# Patient Record
Sex: Male | Born: 1960 | Race: White | Hispanic: No | Marital: Single | Smoking: Current every day smoker
Health system: Southern US, Community
[De-identification: ages and names within clinical notes are randomized; demographics above are authoritative.]

## PROBLEM LIST (undated history)

## (undated) DIAGNOSIS — K259 Gastric ulcer, unspecified as acute or chronic, without hemorrhage or perforation: Secondary | ICD-10-CM

## (undated) DIAGNOSIS — E7800 Pure hypercholesterolemia, unspecified: Secondary | ICD-10-CM

## (undated) HISTORY — PX: SMALL INTESTINE SURGERY: SHX150

## (undated) NOTE — *Deleted (*Deleted)
Come POV, states he was walking quite a bit 3 days ago and now had severe left ankle pain at achilles tendon. No swelling.

---

## 2005-09-28 ENCOUNTER — Inpatient Hospital Stay (HOSPITAL_COMMUNITY): Admission: EM | Admit: 2005-09-28 | Discharge: 2005-10-05 | Payer: Self-pay | Admitting: Emergency Medicine

## 2016-08-08 NOTE — Congregational Nurse Program (Signed)
Congregational Nurse Program Note  Date of Encounter: 08/08/2016  Past Medical History: No past medical history on file.  Encounter Details:     CNP Questionnaire - 08/08/16 2209      Patient Demographics   Is this a new or existing patient? New   Patient is considered a/an Not Applicable   Race Caucasian/White     Patient Assistance   Location of Patient [place]   Patient's financial/insurance status Self-Pay (Uninsured)   Uninsured Patient ([place]) No   Patient referred to apply for the following financial assistance Not Applicable   Food insecurities addressed Provided food supplies   Transportation assistance No   Assistance securing medications No   Educational Programmer, systems the healthcare system     Encounter Details   Primary purpose of visit Navigating the Healthcare System;Chronic Illness/Condition Visit   Was an Emergency Department visit averted? No   Does patient have a medical provider? No   Patient referred to Clinic   Was a mental health screening completed? (GAINS tool) No   Does patient have dental issues? No   Does patient have vision issues? No   Does your patient have an abnormal blood pressure today? No   Since previous encounter, have you referred patient for abnormal blood pressure that resulted in a new diagnosis or medication change? No   Does your patient have an abnormal blood glucose today? No   Since previous encounter, have you referred patient for abnormal blood glucose that resulted in a new diagnosis or medication change? No   Was there a life-saving intervention made? No     Client was seen at the  [facility] to assist with establishing a primary care provider.  Appointment was made with the [facility] to apply for MAP with the [facility]. BP today is 94/74  P-86.  Pearletha Alfred [phone].

## 2017-06-20 ENCOUNTER — Other Ambulatory Visit (HOSPITAL_COMMUNITY): Payer: Self-pay | Admitting: Family Medicine

## 2017-06-20 ENCOUNTER — Ambulatory Visit (HOSPITAL_COMMUNITY)
Admission: RE | Admit: 2017-06-20 | Discharge: 2017-06-20 | Disposition: A | Discharge status: Home / Self Care | Payer: Medicaid Other | Source: Ambulatory Visit | Attending: Family Medicine | Admitting: Family Medicine

## 2017-06-20 ENCOUNTER — Other Ambulatory Visit (HOSPITAL_COMMUNITY)
Admission: RE | Admit: 2017-06-20 | Discharge: 2017-06-20 | Disposition: A | Discharge status: Home / Self Care | Payer: Self-pay | Source: Ambulatory Visit | Attending: Internal Medicine | Admitting: Internal Medicine

## 2017-06-20 DIAGNOSIS — M4322 Fusion of spine, cervical region: Secondary | ICD-10-CM | POA: Insufficient documentation

## 2017-06-20 DIAGNOSIS — Z981 Arthrodesis status: Secondary | ICD-10-CM | POA: Diagnosis not present

## 2017-06-20 DIAGNOSIS — M4326 Fusion of spine, lumbar region: Secondary | ICD-10-CM

## 2017-06-20 DIAGNOSIS — E786 Lipoprotein deficiency: Secondary | ICD-10-CM | POA: Insufficient documentation

## 2019-05-23 ENCOUNTER — Ambulatory Visit: Payer: Medicaid Other | Attending: Internal Medicine

## 2019-05-23 ENCOUNTER — Other Ambulatory Visit: Payer: Self-pay

## 2019-05-23 DIAGNOSIS — Z20822 Contact with and (suspected) exposure to covid-19: Secondary | ICD-10-CM

## 2019-05-24 LAB — NOVEL CORONAVIRUS, NAA: SARS-CoV-2, NAA: NOT DETECTED

## 2020-03-17 ENCOUNTER — Other Ambulatory Visit: Payer: Self-pay

## 2020-03-17 ENCOUNTER — Emergency Department (HOSPITAL_COMMUNITY): Payer: Medicaid Other

## 2020-03-17 ENCOUNTER — Encounter (HOSPITAL_COMMUNITY): Payer: Self-pay

## 2020-03-17 ENCOUNTER — Emergency Department (HOSPITAL_COMMUNITY)
Admission: EM | Admit: 2020-03-17 | Discharge: 2020-03-17 | Disposition: A | Discharge status: Home / Self Care | Payer: Medicaid Other | Attending: Emergency Medicine | Admitting: Emergency Medicine

## 2020-03-17 DIAGNOSIS — M25572 Pain in left ankle and joints of left foot: Secondary | ICD-10-CM | POA: Diagnosis present

## 2020-03-17 DIAGNOSIS — F17210 Nicotine dependence, cigarettes, uncomplicated: Secondary | ICD-10-CM | POA: Insufficient documentation

## 2020-03-17 DIAGNOSIS — M7662 Achilles tendinitis, left leg: Secondary | ICD-10-CM | POA: Insufficient documentation

## 2020-03-17 HISTORY — DX: Gastric ulcer, unspecified as acute or chronic, without hemorrhage or perforation: K25.9

## 2020-03-17 HISTORY — DX: Pure hypercholesterolemia, unspecified: E78.00

## 2020-03-17 MED ORDER — DICLOFENAC SODIUM 1 % EX GEL: 2.0000 g | Freq: Four times a day (QID) | CUTANEOUS | 1 refills | Status: AC

## 2020-03-17 NOTE — Discharge Instructions (Addendum)
Return if any problems.

## 2020-03-17 NOTE — ED Provider Notes (Signed)
[facility] EMERGENCY DEPARTMENT Provider Note   CSN: [id number] Arrival date & time: 03/17/20  3557     History Chief Complaint  Patient presents with  . Ankle Pain    Earl Nelson is a 59 y.o. male.  The history is provided by the patient. No language interpreter was used.  Ankle Pain Location:  Ankle Time since incident:  3 days Injury: no   Ankle location:  L ankle Pain details:    Quality:  Aching   Severity:  Moderate   Onset quality:  Gradual   Timing:  Constant   Progression:  Worsening Foreign body present:  No foreign bodies Prior injury to area:  No Relieved by:  Nothing Worsened by:  Nothing Ineffective treatments:  None tried Pt complains of pain to the back of his ankle and heel.  Pt complains of pain with walking     Past Medical History:  Diagnosis Date  . Gastric ulcer   . Hypercholesteremia     There are no problems to display for this patient.   Past Surgical History:  Procedure Laterality Date  . SMALL INTESTINE SURGERY         No family history on file.  Social History   Tobacco Use  . Smoking status: Current Every Day Smoker    Packs/day: 0.50    Types: Cigarettes  Substance Use Topics  . Alcohol use: Not Currently  . Drug use: Not on file    Home Medications Prior to Admission medications   Medication Sig Start Date End Date Taking? Authorizing Provider  diclofenac Sodium (VOLTAREN) 1 % GEL Apply 2 g topically 4 (four) times daily. 03/17/20   Elson Areas, PA-C    Allergies    Patient has no known allergies.  Review of Systems   Review of Systems  All other systems reviewed and are negative.   Physical Exam Updated Vital Signs BP (!) 112/56   Pulse 76   Temp 98.3 F (36.8 C) (Oral)   Resp 18   Ht 5\' 9"  (1.753 m)   Wt 60.8 kg   SpO2 98%   BMI 19.79 kg/m   Physical Exam Vitals and nursing note reviewed.  Constitutional:      Appearance: He is well-developed.  HENT:     Head: Normocephalic and  atraumatic.  Cardiovascular:     Rate and Rhythm: Normal rate.  Pulmonary:     Effort: Pulmonary effort is normal. No respiratory distress.  Musculoskeletal:        General: Tenderness present. No swelling.     Comments: Tender left posterior heel and achilles,  Pain with plantar and dorsi flexion,  Tender achilles   Skin:    General: Skin is warm and dry.  Neurological:     General: No focal deficit present.     Mental Status: He is alert.  Psychiatric:        Mood and Affect: Mood normal.     ED Results / Procedures / Treatments   Labs (all labs ordered are listed, but only abnormal results are displayed) Labs Reviewed - No data to display  EKG None  Radiology DG Ankle Complete Left  Result Date: 03/17/2020 CLINICAL DATA:  Left ankle pain. EXAM: LEFT ANKLE COMPLETE - 3+ VIEW COMPARISON:  No prior. FINDINGS: Mild diffuse soft tissue swelling. Punctate corticated calcific density noted adjacent to the lateral malleolus, possibly tiny old fracture fragment. No acute fracture noted. No evidence of dislocation. Calcaneal spurring. Mild degenerative  change. Peripheral vascular calcification. IMPRESSION: 1. Mild diffuse soft tissue swelling. Punctate corticated calcific density noted adjacent to the lateral malleolus, possibly tiny old fracture fragment. No acute bony abnormality identified. 2. Peripheral vascular disease. Electronically Signed   By: Maisie Fus  Register   On: 03/17/2020 11:11    Procedures Procedures (including critical care time)  Medications Ordered in ED Medications - No data to display  ED Course  I have reviewed the triage vital signs and the nursing notes.  Pertinent labs & imaging results that were available during my care of the patient were reviewed by me and considered in my medical decision making (see chart for details).    MDM Rules/Calculators/A&P                          MDM:  Pt placed in an aso, given voltaren for area of pain.  Pt advised to  schedule to see Orthopaedist for evaluation Final Clinical Impression(s) / ED Diagnoses Final diagnoses:  Achilles tendinitis of left lower extremity    Rx / DC Orders ED Discharge Orders         Ordered    diclofenac Sodium (VOLTAREN) 1 % GEL  4 times daily        03/17/20 1138        An After Visit Summary was printed and given to the patient.    Elson Areas, [place] 03/17/20 1346    Eber Hong, MD 03/21/20 [phone]

## 2021-01-23 IMAGING — DX DG ANKLE COMPLETE 3+V*L*
3 series · 3 of 3 positions shown · non-contrast
Comparison: No prior.

CLINICAL DATA: Left ankle pain.

EXAM:
LEFT ANKLE COMPLETE - 3+ VIEW

[ankle ap]
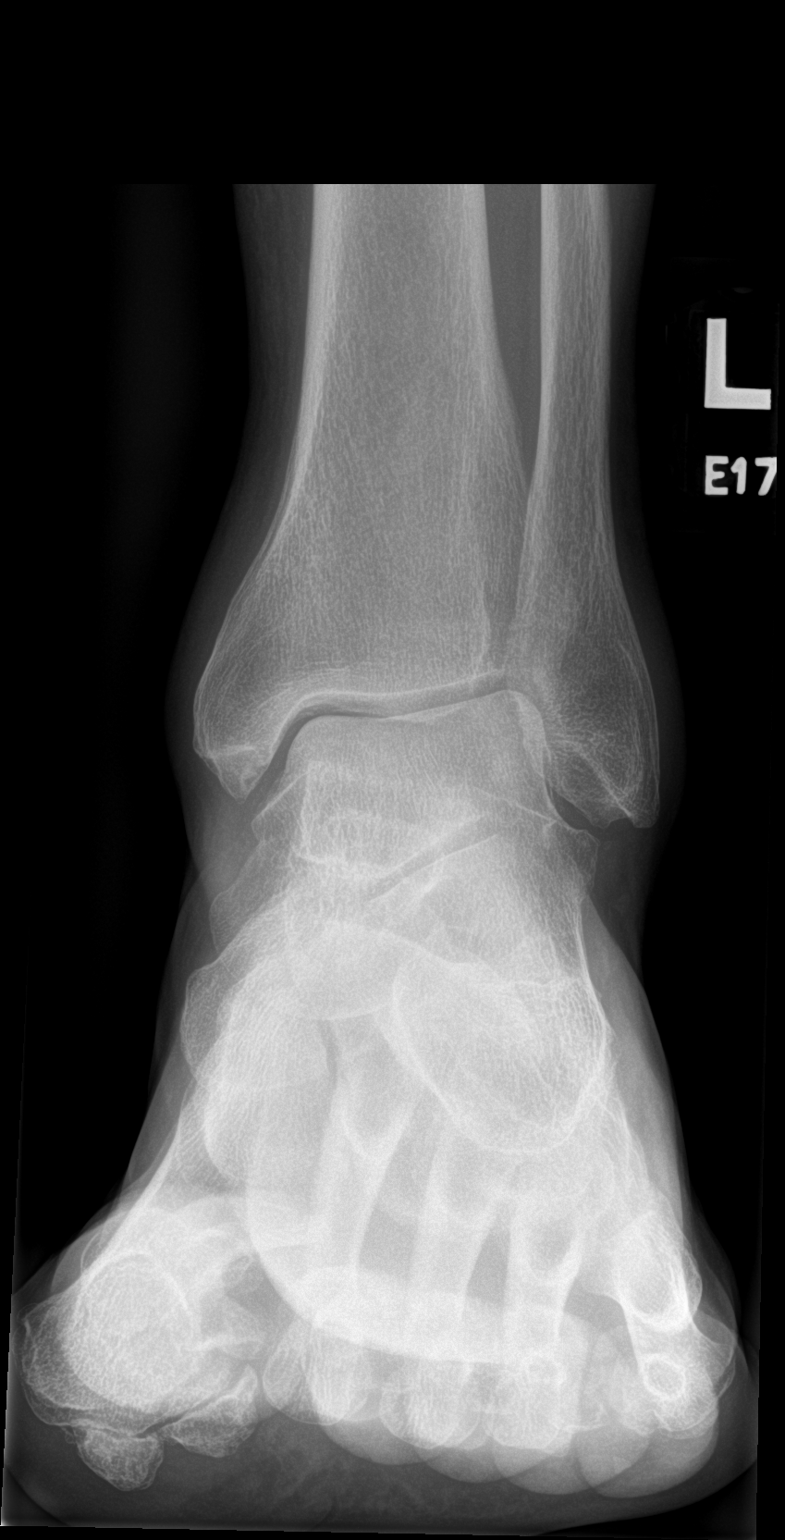

[ankle obl]
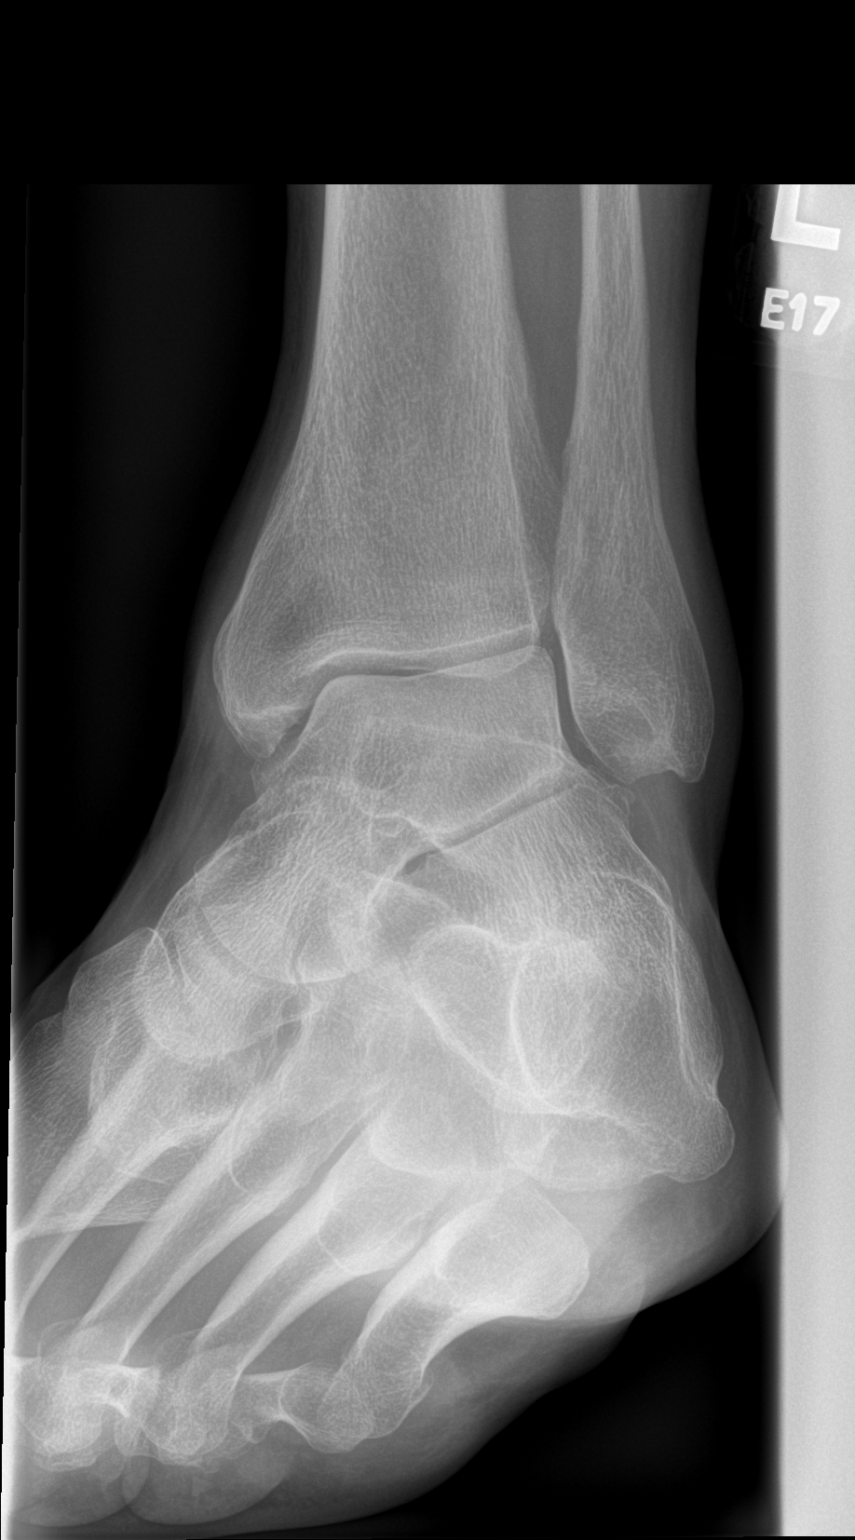

[ankle lat]
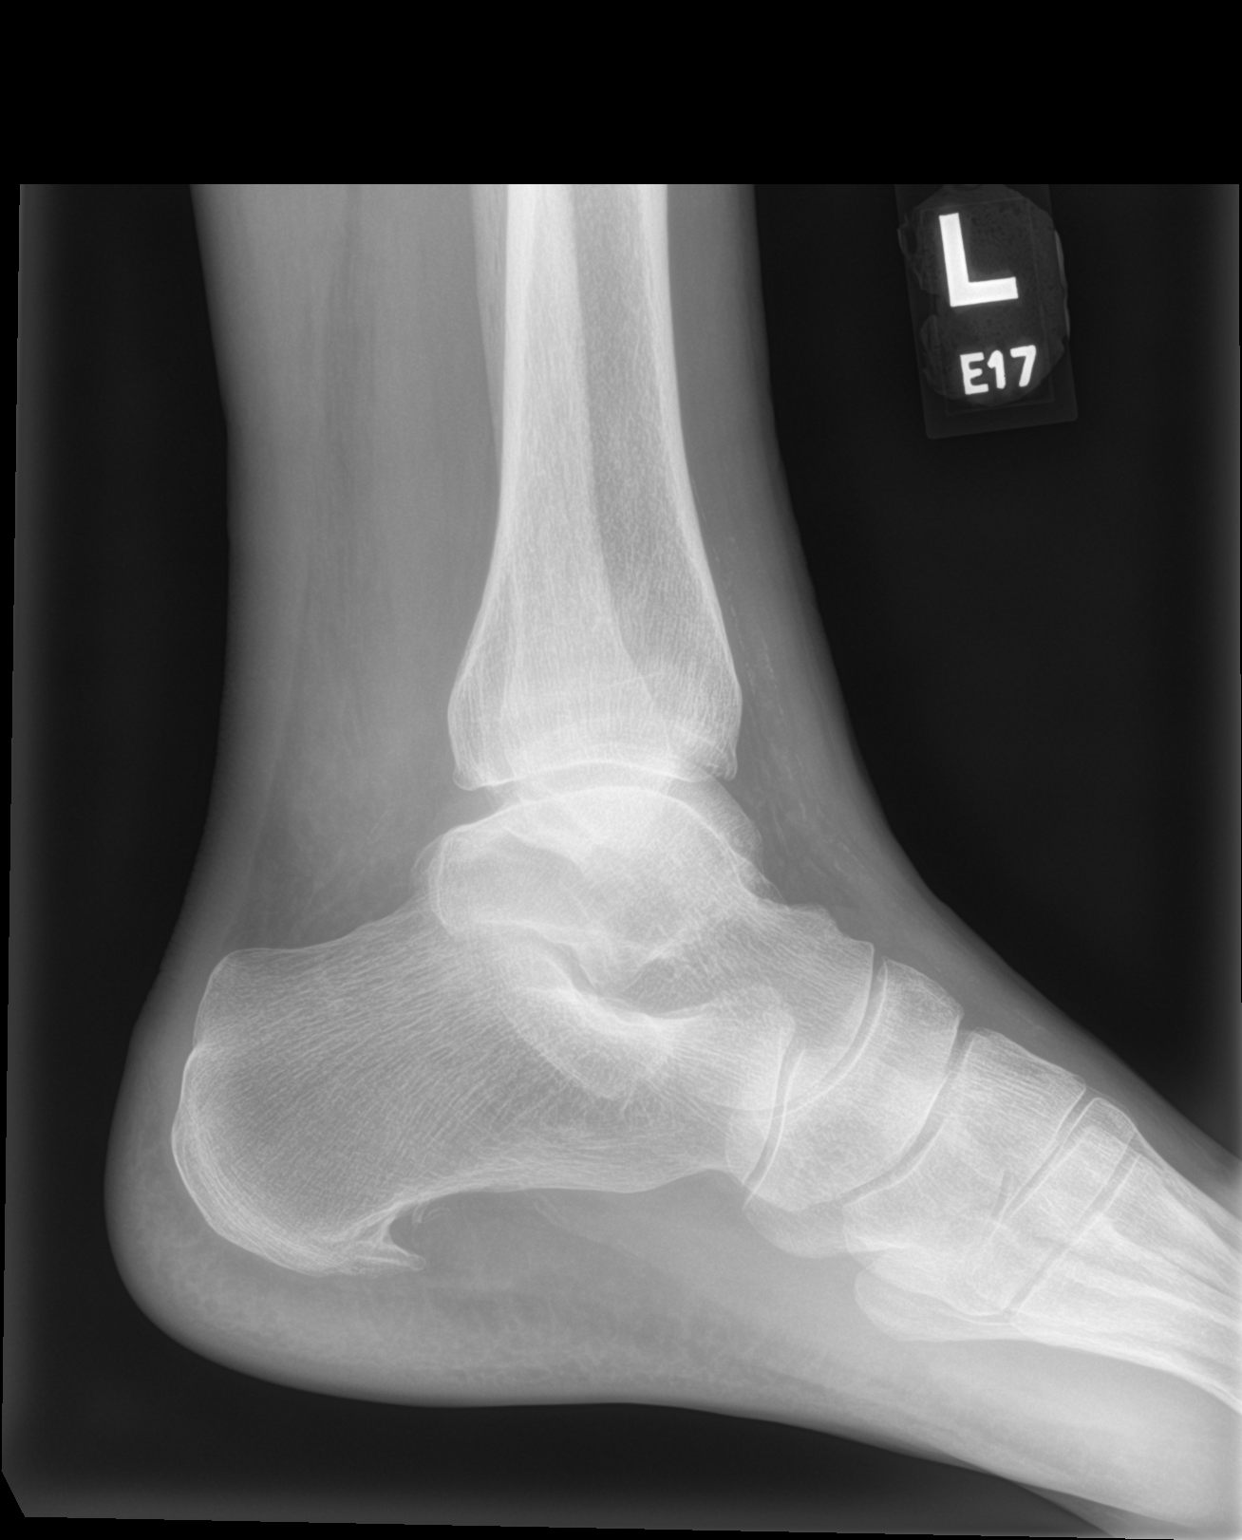

[3 of 3 positions shown; findings below may reference images not displayed]

FINDINGS: Mild diffuse soft tissue swelling. Punctate corticated calcific
density noted adjacent to the lateral malleolus, possibly tiny old
fracture fragment. No acute fracture noted. No evidence of
dislocation. Calcaneal spurring. Mild degenerative change.
Peripheral vascular calcification.
IMPRESSION: 1. Mild diffuse soft tissue swelling. Punctate corticated calcific
density noted adjacent to the lateral malleolus, possibly tiny old
fracture fragment. No acute bony abnormality identified.
2. Peripheral vascular disease.
